# Patient Record
Sex: Male | Born: 1985 | Race: Black or African American | Hispanic: No | Marital: Single | Smoking: Never smoker
Health system: Southern US, Community
[De-identification: ages and names within clinical notes are randomized; demographics above are authoritative.]

---

## 2007-01-22 ENCOUNTER — Emergency Department (HOSPITAL_COMMUNITY): Admission: EM | Admit: 2007-01-22 | Discharge: 2007-01-22 | Payer: Self-pay | Admitting: Emergency Medicine

## 2009-03-30 ENCOUNTER — Emergency Department (HOSPITAL_COMMUNITY): Admission: EM | Admit: 2009-03-30 | Discharge: 2009-03-30 | Payer: Self-pay | Admitting: Emergency Medicine

## 2010-06-05 LAB — POCT I-STAT, CHEM 8
HCT: 44 % (ref 39.0–52.0)
Sodium: 133 mEq/L — ABNORMAL LOW (ref 135–145)

## 2010-06-05 LAB — DIFFERENTIAL
Basophils Absolute: 0 10*3/uL (ref 0.0–0.1)
Basophils Relative: 0 % (ref 0–1)
Eosinophils Relative: 0 % (ref 0–5)
Monocytes Absolute: 0.6 10*3/uL (ref 0.1–1.0)
Neutro Abs: 3.3 10*3/uL (ref 1.7–7.7)

## 2010-06-05 LAB — CBC
Hemoglobin: 13.6 g/dL (ref 13.0–17.0)
MCHC: 33.2 g/dL (ref 30.0–36.0)
MCV: 84.8 fL (ref 78.0–100.0)
Platelets: 128 10*3/uL — ABNORMAL LOW (ref 150–400)

## 2010-06-05 LAB — RAPID STREP SCREEN (MED CTR MEBANE ONLY): Streptococcus, Group A Screen (Direct): NEGATIVE

## 2013-07-22 ENCOUNTER — Encounter (HOSPITAL_COMMUNITY): Payer: Self-pay | Admitting: Emergency Medicine

## 2013-07-22 ENCOUNTER — Emergency Department (HOSPITAL_COMMUNITY): Payer: 59

## 2013-07-22 ENCOUNTER — Emergency Department (HOSPITAL_COMMUNITY)
Admission: EM | Admit: 2013-07-22 | Discharge: 2013-07-22 | Disposition: A | Discharge status: Home / Self Care | Payer: 59 | Attending: Emergency Medicine | Admitting: Emergency Medicine

## 2013-07-22 DIAGNOSIS — R609 Edema, unspecified: Secondary | ICD-10-CM | POA: Insufficient documentation

## 2013-07-22 DIAGNOSIS — M25579 Pain in unspecified ankle and joints of unspecified foot: Secondary | ICD-10-CM | POA: Insufficient documentation

## 2013-07-22 DIAGNOSIS — M79672 Pain in left foot: Secondary | ICD-10-CM

## 2013-07-22 DIAGNOSIS — M25476 Effusion, unspecified foot: Secondary | ICD-10-CM | POA: Insufficient documentation

## 2013-07-22 DIAGNOSIS — M25473 Effusion, unspecified ankle: Secondary | ICD-10-CM | POA: Insufficient documentation

## 2013-07-22 MED ORDER — NAPROXEN 500 MG PO TABS: 500.0000 mg | ORAL_TABLET | Freq: Two times a day (BID) | ORAL | Status: AC

## 2013-07-22 NOTE — ED Notes (Signed)
Pt. reports left foot pain with swelling onset Friday , denies injury , ambulatory .

## 2013-07-22 NOTE — Discharge Instructions (Signed)
Edema °Edema is a buildup of fluids. It is most common in the feet, ankles, and legs. This happens more as a person ages. It may affect one or both legs. °HOME CARE  °· Raise (elevate) the legs or ankles above the level of the heart while lying down. °· Avoid sitting or standing still for a long time. °· Exercise the legs to help the puffiness (swelling) go down. °· A low-salt diet may help lessen the puffiness. °· Only take medicine as told by your doctor. °GET HELP RIGHT AWAY IF:  °· You develop shortness of breath or chest pain. °· You cannot breathe when you lie down. °· You have more puffiness that does not go away with treatment. °· You develop pain or redness in the areas that are puffy. °· You have a temperature by mouth above 102° F (38.9° C), not controlled by medicine. °· You gain 03 lb/1.4 kg or more in 1 day or 05 lb/2.3 kg in a week. °MAKE SURE YOU:  °· Understand these instructions. °· Will watch your condition. °· Will get help right away if you are not doing well or get worse. °Document Released: 08/23/2007 Document Revised: 05/29/2011 Document Reviewed: 08/23/2007 °ExitCare® Patient Information ©2014 [employer]. ° °

## 2013-07-22 NOTE — ED Provider Notes (Signed)
CSN: [id number]     Arrival date & time 07/22/13  1928 History  This chart was scribed for Evan Mornavid Payge Eppes, NP working with Rolland PorterMark James, MD, by Beverly MilchJ Harrison Collins ED Scribe. This patient was seen in room TR05C/TR05C and the patient's care was started at 9:22 PM.    Chief Complaint  Patient presents with  . Foot Pain    The history is provided by the patient. No language interpreter was used.   HPI Comments: Evan Flores is a 28 y.o. male who presents to the Emergency Department complaining of gradually worsening left foot pain that began 4 days ago. He states he was walking and felt a quick sharp pain that went away. 2 days later he noticed swelling and throbbing pain when he woke up. He states the pain is worse in the morning and more so to the dorsum of the foot. Pt denies injury, animal or insect bites that he is aware of. He states he is ambulatory with pain. Pt states he took some ibuprofen this morning with no relief. Pt states his job allows him to be seated. Pt states he has no PCP. Pt denies any daily medications and any h/o DVT's or PE's.   History reviewed. No pertinent past medical history. History reviewed. No pertinent past surgical history. No family history on file. History  Substance Use Topics  . Smoking status: Never Smoker   . Smokeless tobacco: Not on file  . Alcohol Use: No    Review of Systems  Constitutional: Negative for fever and chills.  HENT: Negative for congestion, sinus pressure and sore throat.   Cardiovascular: Positive for leg swelling.  Gastrointestinal: Negative for nausea, vomiting and abdominal pain.  Musculoskeletal: Positive for arthralgias, joint swelling and myalgias.  Neurological: Negative for weakness and numbness.  All other systems reviewed and are negative.   Allergies  Review of patient's allergies indicates no known allergies.   Home Medications    Prior to Admission medications   Medication Sig Start Date End Date Taking?  Authorizing Provider  ibuprofen (ADVIL,MOTRIN) 200 MG tablet Take 400 mg by mouth every 6 (six) hours as needed for mild pain.   Yes Historical Provider, MD    Triage Vitals: BP 120/75  Pulse 75  Temp(Src) 98.1 F (36.7 C) (Oral)  Resp 18  SpO2 100%   Physical Exam  Nursing note and vitals reviewed. Constitutional: He is oriented to person, place, and time. He appears well-developed and well-nourished. No distress.  HENT:  Head: Normocephalic and atraumatic.  Mouth/Throat: No oropharyngeal exudate.  Eyes: Conjunctivae and EOM are normal. Pupils are equal, round, and reactive to light. No scleral icterus.  Neck: Normal range of motion. Neck supple. No thyromegaly present.  Cardiovascular: Normal rate, regular rhythm and normal heart sounds.  Exam reveals no gallop and no friction rub.   No murmur heard. Pulmonary/Chest: Effort normal and breath sounds normal. No stridor. He has no wheezes. He has no rales. He exhibits no tenderness.  Abdominal: Soft. Bowel sounds are normal. He exhibits no distension. There is no tenderness. There is no rebound.  Musculoskeletal: Normal range of motion. He exhibits edema.  Swelling to the dorsal aspect of the foot spreading to the lateral malleolus, good capillary refill, NVI  Lymphadenopathy:    He has no cervical adenopathy.  Neurological: He is alert and oriented to person, place, and time. He exhibits normal muscle tone. Coordination normal.  Skin: Skin is warm and dry. No rash noted. He is not  diaphoretic. No erythema.  Psychiatric: He has a normal mood and affect. His behavior is normal.    ED Course  Procedures (including critical care time)   DIAGNOSTIC STUDIES: Oxygen Saturation is 100% on RA, normal by my interpretation.     COORDINATION OF CARE: 9:29 PM- Pt advised of plan for treatment and pt agrees.   Labs Review Labs Reviewed - No data to display   Imaging Review Dg Foot Complete Left  07/22/2013   CLINICAL DATA:  Left  foot pain for 2 days  EXAM: LEFT FOOT - COMPLETE 3+ VIEW  COMPARISON:  None.  FINDINGS: Three views of the left foot submitted. No acute fracture or subluxation. There is a metallic BB anterior superior aspect of the talus.  IMPRESSION: No acute fracture or subluxation. Metallic BB anterior superior aspect of the talus probable chronic in nature.   Electronically Signed   By: Natasha MeadLiviu  Pop M.D.   On: 07/22/2013 20:57      EKG Interpretation None     Radiology results reviewed and shared with patient. MDM   Final diagnoses:  None    Left foot edema.  No known recent injury. No calf pain. Strong pedal and dorsalis pulses.  Brisk cap refill. Anti-inflammatory, compression, ice, elevation.  Crutches.  PCP/Ortho follow-up if no improvement  I personally performed the services described in this documentation, which was scribed in my presence. The recorded information has been reviewed and is accurate.    Jimmye Normanavid John Mackenzey Crownover, NP 07/23/13 [phone]

## 2013-07-22 NOTE — ED Notes (Signed)
Onset Friday pt noticed a slight pain to top of left foot.  Ankle and foot swollen and bruised.  Pain getting worse.  Pt unable to bear weight to bare foot but when puts socks and tennis shoes on is able to bear weight on foot.  No injuries noted.

## 2013-07-30 NOTE — ED Provider Notes (Signed)
Medical screening examination/treatment/procedure(s) were performed by non-physician practitioner and as supervising physician I was immediately available for consultation/collaboration.   EKG Interpretation None        Rolland PorterMark Antinio Sanderfer, MD 07/30/13 [phone]

## 2014-12-15 ENCOUNTER — Emergency Department (HOSPITAL_COMMUNITY): Payer: No Typology Code available for payment source

## 2014-12-15 ENCOUNTER — Emergency Department (HOSPITAL_COMMUNITY)
Admission: EM | Admit: 2014-12-15 | Discharge: 2014-12-15 | Disposition: A | Discharge status: Home / Self Care | Payer: No Typology Code available for payment source | Attending: Physician Assistant | Admitting: Physician Assistant

## 2014-12-15 ENCOUNTER — Encounter (HOSPITAL_COMMUNITY): Payer: Self-pay | Admitting: Emergency Medicine

## 2014-12-15 DIAGNOSIS — M545 Low back pain: Secondary | ICD-10-CM | POA: Diagnosis present

## 2014-12-15 DIAGNOSIS — R079 Chest pain, unspecified: Secondary | ICD-10-CM | POA: Insufficient documentation

## 2014-12-15 DIAGNOSIS — M62838 Other muscle spasm: Secondary | ICD-10-CM | POA: Insufficient documentation

## 2014-12-15 LAB — BASIC METABOLIC PANEL
ANION GAP: 8 (ref 5–15)
BUN: 11 mg/dL (ref 6–20)
CALCIUM: 9 mg/dL (ref 8.9–10.3)
CO2: 26 mmol/L (ref 22–32)
Chloride: 104 mmol/L (ref 101–111)
Creatinine, Ser: 1.25 mg/dL — ABNORMAL HIGH (ref 0.61–1.24)
GFR calc non Af Amer: >60 mL/min (ref >60)
Glucose, Bld: 107 mg/dL — ABNORMAL HIGH (ref 65–99)
Potassium: 4.1 mmol/L (ref 3.5–5.1)
SODIUM: 138 mmol/L (ref 135–145)

## 2014-12-15 LAB — CBC
HCT: 40 % (ref 39.0–52.0)
HEMOGLOBIN: 13.2 g/dL (ref 13.0–17.0)
MCH: 27.6 pg (ref 26.0–34.0)
MCHC: 33 g/dL (ref 30.0–36.0)
MCV: 83.5 fL (ref 78.0–100.0)
Platelets: 191 10*3/uL (ref 150–400)
RBC: 4.79 MIL/uL (ref 4.22–5.81)
RDW: 12.6 % (ref 11.5–15.5)
WBC: 5.6 10*3/uL (ref 4.0–10.5)

## 2014-12-15 LAB — I-STAT TROPONIN, ED: TROPONIN I, POC: 0 ng/mL (ref 0.00–0.08)

## 2014-12-15 MED ORDER — IBUPROFEN 800 MG PO TABS
800.0000 mg | ORAL_TABLET | Freq: Once | ORAL | Status: AC
  Administered 2014-12-15: 800 mg via ORAL
  Filled 2014-12-15: qty 1

## 2014-12-15 MED ORDER — IBUPROFEN 800 MG PO TABS: 800.0000 mg | ORAL_TABLET | Freq: Three times a day (TID) | ORAL | Status: AC

## 2014-12-15 MED ORDER — CYCLOBENZAPRINE HCL 10 MG PO TABS: 10.0000 mg | ORAL_TABLET | Freq: Two times a day (BID) | ORAL | Status: AC | PRN

## 2014-12-15 MED ORDER — CYCLOBENZAPRINE HCL 10 MG PO TABS
10.0000 mg | ORAL_TABLET | Freq: Once | ORAL | Status: AC
  Administered 2014-12-15: 10 mg via ORAL
  Filled 2014-12-15: qty 1

## 2014-12-15 NOTE — Discharge Instructions (Signed)
Back Pain, Adult °Back pain is very common. The pain often gets better over time. The cause of back pain is usually not dangerous. Most people can learn to manage their back pain on their own.  °HOME CARE  °· Stay active. Start with short walks on flat ground if you can. Try to walk farther each day. °· Do not sit, drive, or stand in one place for more than 30 minutes. Do not stay in bed. °· Do not avoid exercise or work. Activity can help your back heal faster. °· Be careful when you bend or lift an object. Bend at your knees, keep the object close to you, and do not twist. °· Sleep on a firm mattress. Lie on your side, and bend your knees. If you lie on your back, put a pillow under your knees. °· Only take medicines as told by your doctor. °· Put ice on the injured area. °¨ Put ice in a plastic bag. °¨ Place a towel between your skin and the bag. °¨ Leave the ice on for 15-20 minutes, 03-04 times a day for the first 2 to 3 days. After that, you can switch between ice and heat packs. °· Ask your doctor about back exercises or massage. °· Avoid feeling anxious or stressed. Find good ways to deal with stress, such as exercise. °GET HELP RIGHT AWAY IF:  °· Your pain does not go away with rest or medicine. °· Your pain does not go away in 1 week. °· You have new problems. °· You do not feel well. °· The pain spreads into your legs. °· You cannot control when you poop (bowel movement) or pee (urinate). °· Your arms or legs feel weak or lose feeling (numbness). °· You feel sick to your stomach (nauseous) or throw up (vomit). °· You have belly (abdominal) pain. °· You feel like you may pass out (faint). °MAKE SURE YOU:  °· Understand these instructions. °· Will watch your condition. °· Will get help right away if you are not doing well or get worse. °Document Released: 08/23/2007 Document Revised: 05/29/2011 Document Reviewed: 07/08/2013 °ExitCare® Patient Information ©2015 [employer]. This information is not intended  to replace advice given to you by your health care provider. Make sure you discuss any questions you have with your health care provider. ° °
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
## 2015-05-03 IMAGING — CR DG FOOT COMPLETE 3+V*L*
3 series · 3 of 3 positions shown · non-contrast
Comparison: None.

CLINICAL DATA: Left foot pain for 2 days

EXAM:
LEFT FOOT - COMPLETE 3+ VIEW

[t foot ap left]
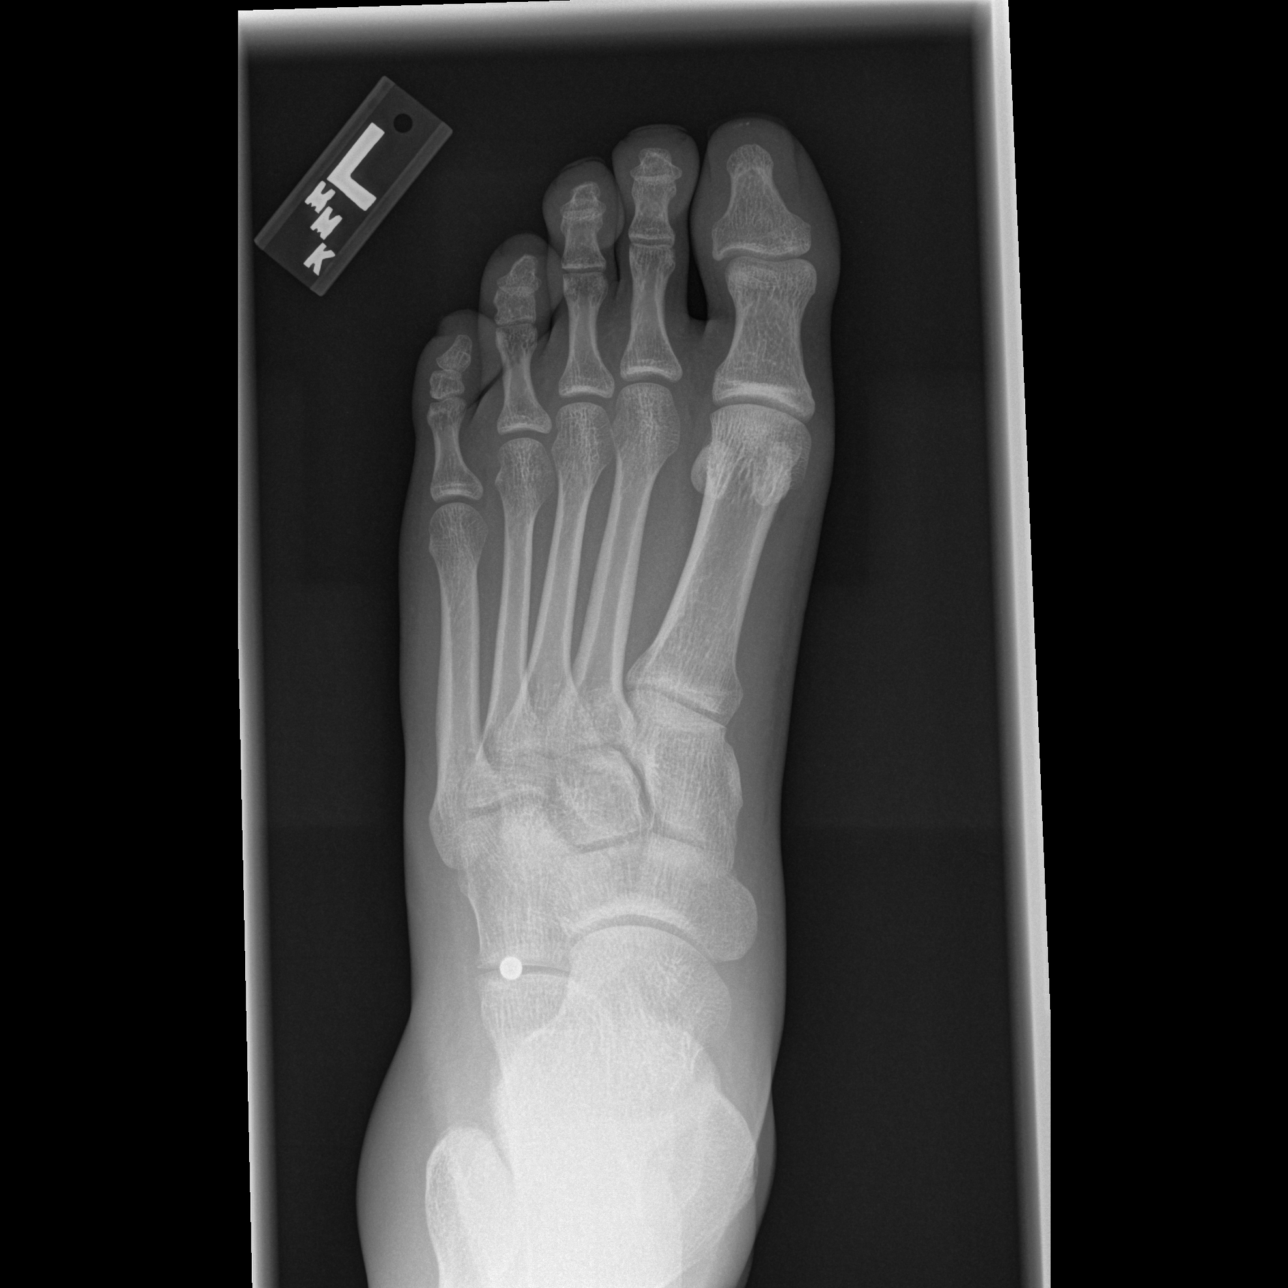

[t foot oblique left]
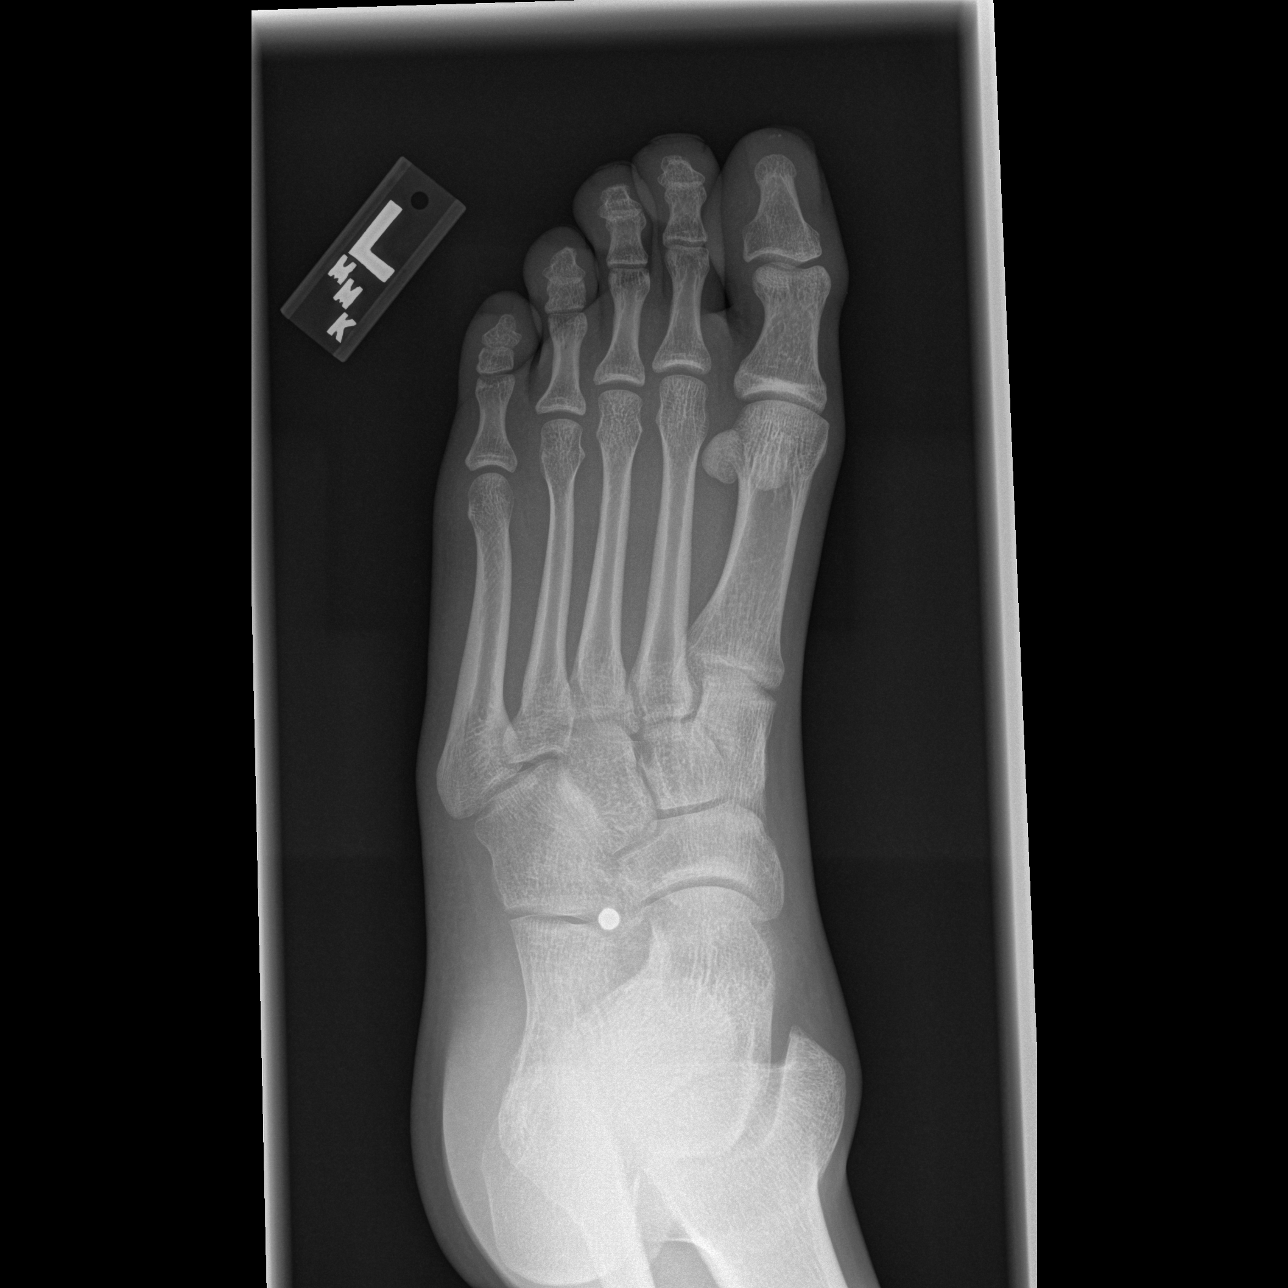

[t foot lat left]
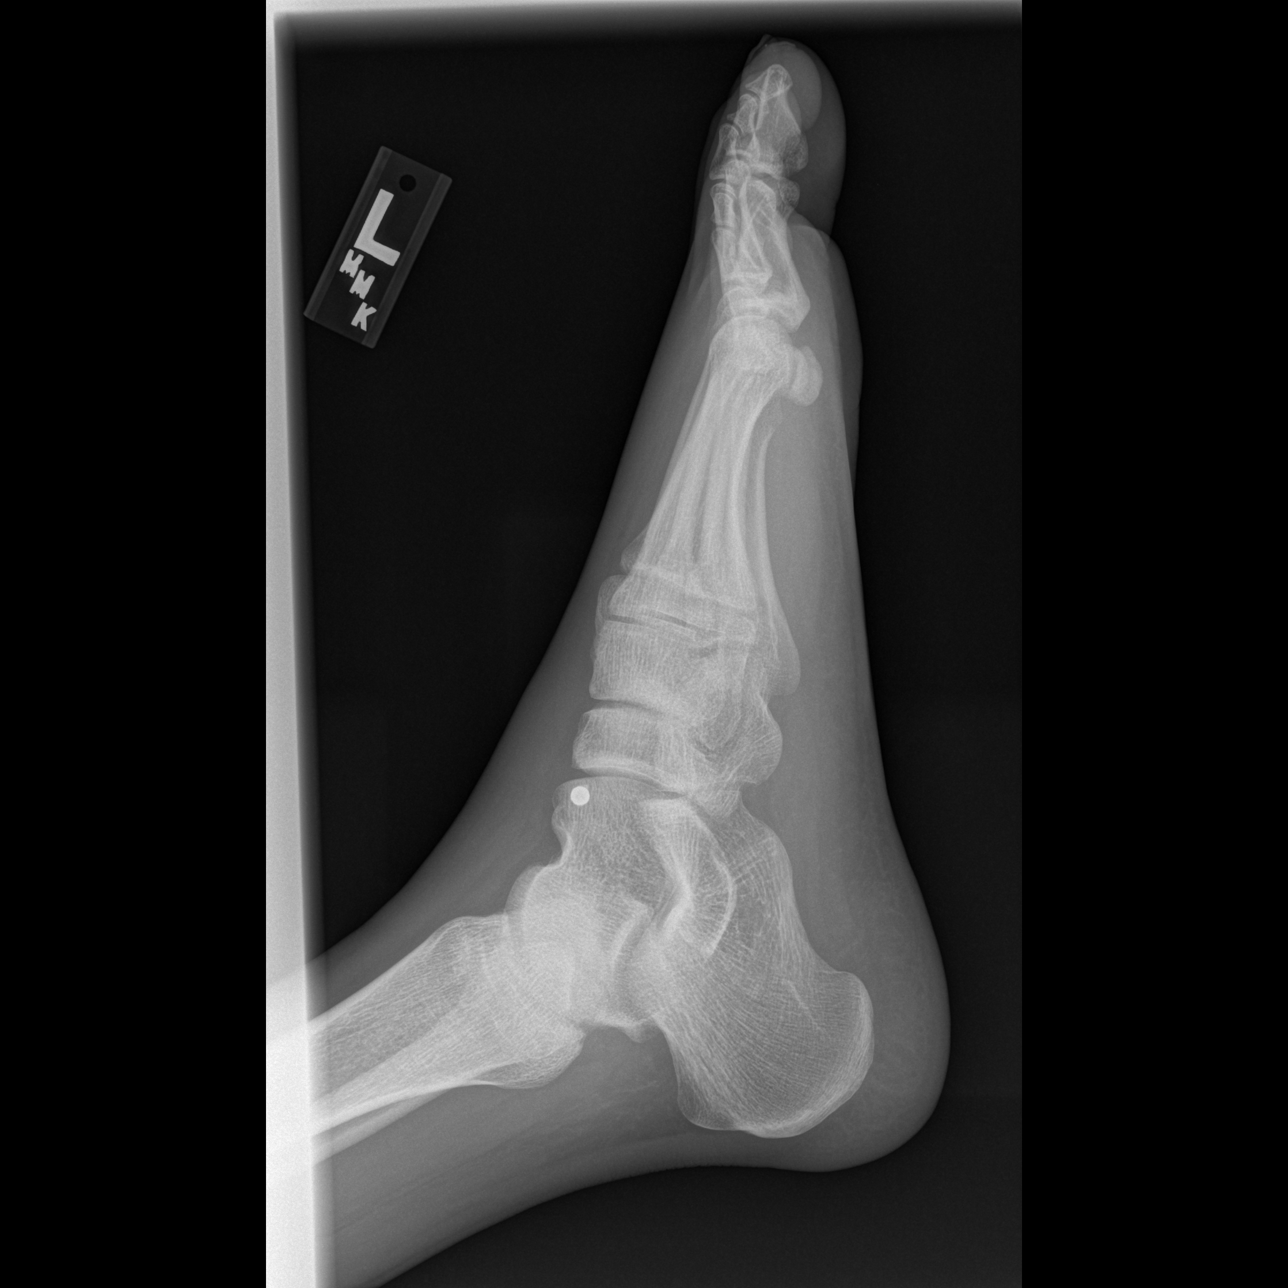

[3 of 3 positions shown; findings below may reference images not displayed]

FINDINGS: Three views of the left foot submitted. No acute fracture or
subluxation. There is a metallic BB anterior superior aspect of the
talus.
IMPRESSION: No acute fracture or subluxation. Metallic BB anterior superior
aspect of the talus probable chronic in nature.
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
